# Patient Record
Sex: Male | Born: 1955 | Race: White | Hispanic: No | Marital: Married | Smoking: Never smoker
Health system: Southern US, Community
[De-identification: ages and names within clinical notes are randomized; demographics above are authoritative.]

## PROBLEM LIST (undated history)

## (undated) DIAGNOSIS — I10 Essential (primary) hypertension: Secondary | ICD-10-CM

## (undated) DIAGNOSIS — E119 Type 2 diabetes mellitus without complications: Secondary | ICD-10-CM

## (undated) DIAGNOSIS — E785 Hyperlipidemia, unspecified: Secondary | ICD-10-CM

## (undated) DIAGNOSIS — C61 Malignant neoplasm of prostate: Secondary | ICD-10-CM

## (undated) HISTORY — DX: Essential (primary) hypertension: I10

## (undated) HISTORY — DX: Hyperlipidemia, unspecified: E78.5

## (undated) HISTORY — PX: HERNIA REPAIR: SHX51

## (undated) HISTORY — PX: PROSTATECTOMY: SHX69

## (undated) HISTORY — PX: BACK SURGERY: SHX140

## (undated) HISTORY — DX: Malignant neoplasm of prostate: C61

---

## 2001-10-16 ENCOUNTER — Emergency Department (HOSPITAL_COMMUNITY): Admission: EM | Admit: 2001-10-16 | Discharge: 2001-10-16 | Payer: Self-pay | Admitting: *Deleted

## 2001-10-22 ENCOUNTER — Emergency Department (HOSPITAL_COMMUNITY): Admission: EM | Admit: 2001-10-22 | Discharge: 2001-10-22 | Payer: Self-pay | Admitting: Emergency Medicine

## 2008-04-14 ENCOUNTER — Encounter (INDEPENDENT_AMBULATORY_CARE_PROVIDER_SITE_OTHER): Payer: Self-pay | Admitting: Urology

## 2008-04-14 ENCOUNTER — Inpatient Hospital Stay (HOSPITAL_COMMUNITY): Admission: RE | Admit: 2008-04-14 | Discharge: 2008-04-16 | Payer: Self-pay | Admitting: Urology

## 2008-10-03 ENCOUNTER — Encounter: Admission: RE | Admit: 2008-10-03 | Discharge: 2008-10-03 | Payer: Self-pay | Admitting: Orthopedic Surgery

## 2010-12-31 ENCOUNTER — Encounter: Payer: Self-pay | Admitting: Orthopedic Surgery

## 2011-04-24 NOTE — Op Note (Signed)
Mark Velez, RODRIGUE NO.:  [id number]   MEDICAL RECORD NO.:  [id number]          PATIENT TYPE:  INP   LOCATION:  1432                         FACILITY:  [facility]   PHYSICIAN:  Valetta Fuller, M.D.  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   PROCEDURE PERFORMED:  Robotic assisted laparoscopic radical retropubic  prostatectomy.   SURGEON:  Valetta Fuller, M.D.   ASSISTANTEarlene Plater.   ANESTHESIA:  General endotracheal.   INDICATIONS:  Mark Velez is a 55 year old male.  He was initially  seen and then diagnosed with favorable clinical stage T1C adenocarcinoma  of the prostate by Dr. Retta Diones.  The patient ended up with a Gleason 3  plus 3 equal 6 cancer.  The biopsies were limited to the right apex of  the prostate.  The patient's PSA was minimally elevated at 4.2.  The  patient had moderate prostatic enlargement with a prostate of  approximately 65 grams.  He had normal sexual functioning with an IPSS  score of 6.  The patient underwent extensive consultation with Dr.  Retta Diones as well as myself about his treatment options.  We felt that  he did have relatively low risk prostate cancer, but also was a very  young.  Preoperative cardiac clearance was also obtained.  After  discussing all the options with him, he elected to proceed with radical  retropubic prostatectomy by a robotic laparoscopic approach.  He  appeared to understand the potential complications and issues with  regard to long-term incontinence, sexual dysfunction as well as issues  related to surgery of this magnitude.  Full informed consent was  obtained.   TECHNIQUE AND FINDINGS:  The patient was brought to the  operating room.  He had placement of compression boots to the knee.  He received  perioperative Unasyn.  He had successful induction of general  endotracheal anesthesia and then was placed in the mid lithotomy  position.  He was carefully secured to the operating table.  All  extremities were carefully padded.  He was placed in a steep  Trendelenburg position and prepped and draped in the usual manner.  Foley catheter was inserted sterilely on the field.   Initial incision site was chosen just to the left of the umbilicus  approximately 18 cm above the pubic symphysis.  A standard open Hasson  technique was utilized and a 12 mm cannula was placed.  The abdomen was  insufflated without incident.  Careful inspection of the pelvis revealed  no obvious pathology.  All other trocars were placed with direct visual  guidance.  This included 12 and 5 mm assist ports and three 8 mm robotic  trocars.  Once all trocars were in position, the surgical cart was  docked.  The bladder was filled.  The space of Retzius was entered  utilizing hot electrocautery  scissors.  There was considerable fat  overlying the prostatic endopelvic fascia and sidewall which was  carefully taken off to help identify the prostate and bladder neck  region.  Endopelvic fascia was then incised from apex to base.  Levator  musculature was swept off the apex of the prostate and the dorsal venous  complex was isolated and then stapled.  There was moderate oozing from  the dorsal vein complex and more blood loss than typical was  encountered.  Suture ligation of the dorsal vein complex was required to  control that which was then established.  Attention was then turned  towards the anterior bladder neck.  This was incised after  identification with the aid of the Foley balloon.  Once the anterior  bladder neck was incised, the Foley catheter was identified in the  midline and brought up anteriorly to provide traction.  It was  obvious  that the patient did have a component of a middle lobe.  Ureteral  orifices were identified with the aid of indigo carmine.  The posterior  bladder neck was then transected along with the middle lobe of the  prostate.  Posteriorly vas deferens and seminal vesicles were each  individually isolated and dissected free.  Clips were used on the tips  of the seminal vesicles to reduce cautery usage.  Once this was  completed, the posterior plane between the prostate and rectum was  established.   Attention was then turned towards the pedicles of the prostate.  First  however, we incised the lateral superficial fascia off the prostate  bilaterally.  This was then used to establish the nerve-sparing plain  from apex to base and the neurovascular bundles were swept off the  lateral posterior aspect of the prostate bilaterally with what appeared  to be excellent preservation of a large amount of neurovascular tissue  along with intact superficial fascia right on the side of the prostate.  The pedicles were then clipped with hematic clips.  Neurovascular bundle  was then slipped off the apex of the prostate isolating the urethra.  This was then transected anteriorly, the catheter was removed and the  posterior transection occurred.  The prostatic specimen was then removed  from the pelvis and the pelvis was copiously irrigated.  There was  additional oozing from the left vascular pedicle which required some  suture ligation.  The bladder neck was carefully inspected and did not  feel it required formal closure.   Reconstruction then ensued.  We did not feel that the patient require  pelvic lymph node dissection because he was at such low risk for  positive nodes.  The posterior bladder neck and posterior urethra were  reapproximated utilizing a 2-0 Vicryl suture to reapproximate the  structures and also to reduce attention.  Once that was accomplished,  the rest of the anastomosis was  performed with a double arm 3-0 Monocryl  suture in a running 360 degree manner.  A new coude catheter was placed  without difficulty and the bladder anastomosis appeared watertight with  no obvious extravasation of irrigation fluid and clear blue dye coming  from the bladder.  A pelvic drain was placed and then secured to the  skin.  All trocar sites were carefully inspected upon removal.  The  prostatic specimen was placed in the Endopouch bag.  This was then  removed after extension of his camera port incision.  The camera port  incision was then closed with a running #1 Vicryl  suture.  All wounds  were infiltrated with lidocaine/Marcaine.  Skin was then closed with  clips.  The patient appeared to tolerate the procedure well.  Estimated  blood loss was approximately 1300 mL, but he remained hemodynamically  stable throughout the procedure and there appeared to be good urinary  output.  He was brought to the recovery room in stable condition.           ______________________________  Valetta Fuller, M.D.  Electronically Signed     DSG/MEDQ  D:  04/19/2008  T:  04/19/2008  Job:  [id number]

## 2011-09-04 LAB — BASIC METABOLIC PANEL
BUN: 12
CO2: 30
Calcium: 9.7
Chloride: 105
Creatinine, Ser: 0.85
GFR calc Af Amer: >60
GFR calc non Af Amer: >60
Glucose, Bld: 118 — ABNORMAL HIGH
Potassium: 4.2
Sodium: 141

## 2011-09-04 LAB — HEMOGLOBIN AND HEMATOCRIT, BLOOD: Hemoglobin: 14.3

## 2018-11-23 ENCOUNTER — Encounter: Payer: Self-pay | Admitting: Emergency Medicine

## 2018-11-23 ENCOUNTER — Emergency Department
Admission: EM | Admit: 2018-11-23 | Discharge: 2018-11-23 | Disposition: A | Discharge status: Home / Self Care | Payer: 59 | Source: Home / Self Care

## 2018-11-23 DIAGNOSIS — J0120 Acute ethmoidal sinusitis, unspecified: Secondary | ICD-10-CM | POA: Diagnosis not present

## 2018-11-23 HISTORY — DX: Type 2 diabetes mellitus without complications: E11.9

## 2018-11-23 LAB — POCT INFLUENZA A/B
Influenza A, POC: NEGATIVE
Influenza B, POC: NEGATIVE

## 2018-11-23 MED ORDER — AMOXICILLIN-POT CLAVULANATE 875-125 MG PO TABS: 1.0000 | ORAL_TABLET | Freq: Two times a day (BID) | ORAL | 0 refills | Status: DC

## 2018-11-23 NOTE — ED Triage Notes (Signed)
Patient c/o head congestion x 4 days, non-productive cough, facial pressure, runny nose, bilateral ear pain.

## 2018-11-23 NOTE — ED Provider Notes (Signed)
[facility]    CSN: [id number] Arrival date & time: 11/23/18  1112     History   Chief Complaint Chief Complaint  Patient presents with  . URI    HPI Mark Velez is a 62 y.o. male.   The history is provided by the patient. No language interpreter was used.  URI  Presenting symptoms: congestion and cough   Severity:  Moderate Onset quality:  Gradual Duration:  4 days Timing:  Constant Progression:  Worsening Chronicity:  New Relieved by:  Nothing Worsened by:  Nothing Ineffective treatments:  None tried Associated symptoms: sinus pain   Risk factors: diabetes mellitus   Pt complains of sinus drainage and congetsion   Past Medical History:  Diagnosis Date  . Diabetes mellitus without complication (HCC)     There are no active problems to display for this patient.   History reviewed. No pertinent surgical history.     Home Medications    Prior to Admission medications   Medication Sig Start Date End Date Taking? Authorizing Provider  glimepiride (AMARYL) 4 MG tablet Take by mouth. 06/09/18 06/09/19 Yes [provider]  hydrochlorothiazide (HYDRODIURIL) 25 MG tablet TAKE 1 TABLET BY MOUTH EVERY DAY FOR 90 DAYS 11/25/16  Yes [provider]  Insulin Glargine (BASAGLAR KWIKPEN) 100 UNIT/ML SOPN Inject into the skin. 10/23/18  Yes [provider]  lisinopril (PRINIVIL,ZESTRIL) 40 MG tablet Take by mouth. 12/28/15  Yes [provider]  metFORMIN (GLUCOPHAGE-XR) 500 MG 24 hr tablet TAKE 2 TABLETS BY MOUTH TWICE A DAY 08/04/18  Yes [provider]  pravastatin (PRAVACHOL) 80 MG tablet Take by mouth. 05/15/16  Yes [provider]  amoxicillin-clavulanate (AUGMENTIN) 875-125 MG tablet Take 1 tablet by mouth every 12 (twelve) hours. 11/23/18   Elson Areas, PA-C    Family History No family history on file.  Social History Social History   Tobacco Use  . Smoking status: Never Smoker  .  Smokeless tobacco: Never Used  Substance Use Topics  . Alcohol use: Not on file  . Drug use: Not on file     Allergies   Sulfa antibiotics   Review of Systems Review of Systems  HENT: Positive for congestion and sinus pain.   Respiratory: Positive for cough.   All other systems reviewed and are negative.    Physical Exam Triage Vital Signs ED Triage Vitals  Enc Vitals Group     BP 11/23/18 1134 (!) 142/81     Pulse Rate 11/23/18 1134 86     Resp --      Temp 11/23/18 1134 98 F (36.7 C)     Temp Source 11/23/18 1134 Oral     SpO2 11/23/18 1134 95 %     Weight 11/23/18 1135 233 lb (105.7 kg)     Height 11/23/18 1135 5\' 6"  (1.676 m)     Head Circumference --      Peak Flow --      Pain Score 11/23/18 1135 4     Pain Loc --      Pain Edu? --      Excl. in GC? --    No data found.  Updated Vital Signs BP (!) 142/81 (BP Location: Right Arm)   Pulse 86   Temp 98 F (36.7 C) (Oral)   Ht 5\' 6"  (1.676 m)   Wt 233 lb (105.7 kg)   SpO2 95%   BMI 37.61 kg/m   Visual Acuity Right Eye Distance:  Left Eye Distance:   Bilateral Distance:    Right Eye Near:   Left Eye Near:    Bilateral Near:     Physical Exam Vitals signs and nursing note reviewed.  Constitutional:      Appearance: He is well-developed.  HENT:     Head: Normocephalic and atraumatic.     Right Ear: Tympanic membrane normal.     Left Ear: Tympanic membrane normal.     Nose: Nose normal.     Mouth/Throat:     Mouth: Mucous membranes are moist.  Eyes:     Conjunctiva/sclera: Conjunctivae normal.  Neck:     Musculoskeletal: Neck supple.  Cardiovascular:     Rate and Rhythm: Normal rate and regular rhythm.     Heart sounds: No murmur.  Pulmonary:     Effort: Pulmonary effort is normal. No respiratory distress.     Breath sounds: Normal breath sounds.  Abdominal:     Palpations: Abdomen is soft.     Tenderness: There is no abdominal tenderness.  Skin:    General: Skin is warm and dry.    Neurological:     Mental Status: He is alert.      UC Treatments / Results  Labs (all labs ordered are listed, but only abnormal results are displayed) Labs Reviewed  POCT INFLUENZA A/B    EKG None  Radiology No results found.  Procedures Procedures (including critical care time)  Medications Ordered in UC Medications - No data to display  Initial Impression / Assessment and Plan / UC Course  I have reviewed the triage vital signs and the nursing notes.  Pertinent labs & imaging results that were available during my care of the patient were reviewed by me and considered in my medical decision making (see chart for details).     MDM  Influenza negative.  Pt started on augmentin.  Pt advised to see his MD for recheck in 3-4 days.  Final Clinical Impressions(s) / UC Diagnoses   Final diagnoses:  Acute ethmoidal sinusitis, recurrence not specified     Discharge Instructions     Return if any problems.    ED Prescriptions    Medication Sig Dispense Auth. Provider   amoxicillin-clavulanate (AUGMENTIN) 875-125 MG tablet Take 1 tablet by mouth every 12 (twelve) hours. 20 tablet Elson AreasSofia, Wen Merced K, [place]PA-C     Controlled Substance Prescriptions [place] Controlled Substance Registry consulted? Not Applicable  An After Visit Summary was printed and given to the patient.    Elson AreasSofia, Kimmberly Wisser K, [place]-C 11/23/18 1242

## 2018-11-23 NOTE — Discharge Instructions (Signed)
Return if any problems.

## 2021-06-26 ENCOUNTER — Other Ambulatory Visit: Payer: Self-pay

## 2021-06-26 DIAGNOSIS — M25552 Pain in left hip: Secondary | ICD-10-CM

## 2021-07-08 ENCOUNTER — Other Ambulatory Visit: Payer: 59

## 2021-07-17 ENCOUNTER — Ambulatory Visit
Admission: RE | Admit: 2021-07-17 | Discharge: 2021-07-17 | Disposition: A | Discharge status: Home / Self Care | Payer: 59 | Source: Ambulatory Visit | Attending: Orthopedic Surgery | Admitting: Orthopedic Surgery

## 2021-07-17 ENCOUNTER — Other Ambulatory Visit: Payer: Self-pay

## 2021-07-17 DIAGNOSIS — M25552 Pain in left hip: Secondary | ICD-10-CM

## 2022-01-07 IMAGING — MR MR HIP*L* W/O CM
5 series · 36 of 40 positions shown · non-contrast
Comparison: None.

CLINICAL DATA: Low back, left hip, and leg pain for the past 2
months. No prior surgery.

EXAM:
MR OF THE LEFT HIP WITHOUT CONTRAST
TECHNIQUE: Multiplanar, multisequence MR imaging was performed. No intravenous
contrast was administered.

[Series 8: T2 fat-sat · coronal · left · 3.0mm · 0.89mm/px · 9 of 30 slices shown (1 of 2)]
[im 1/30]
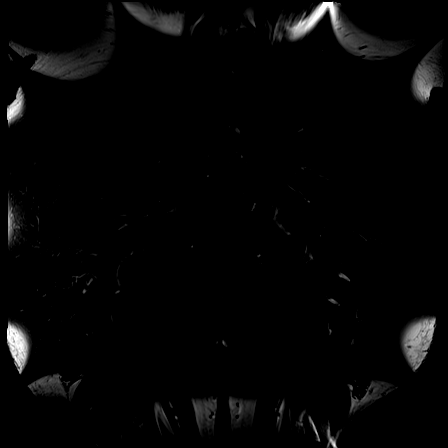
[im 4/30]
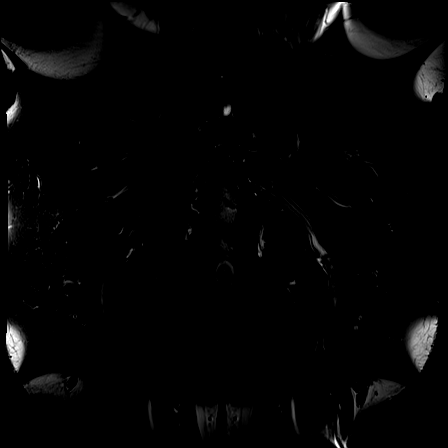
[im 8/30]
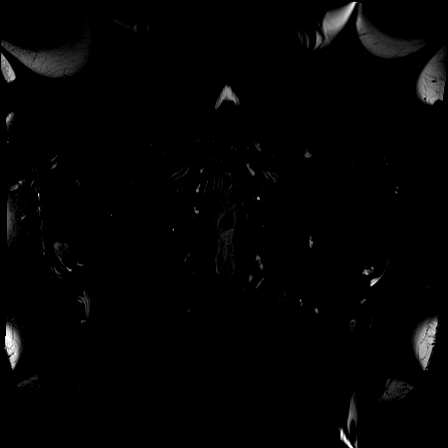
[im 11/30]
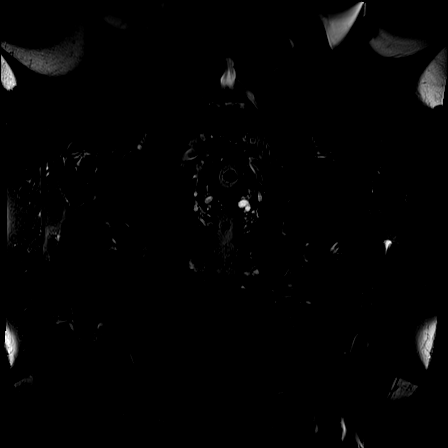
[im 15/30]
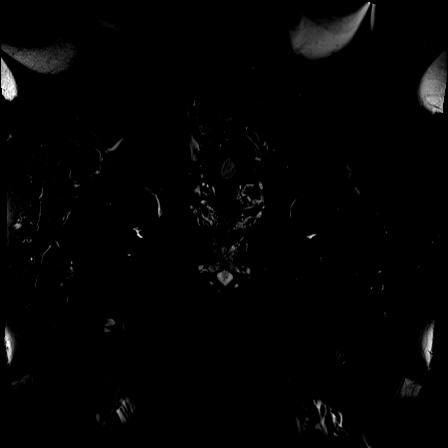
[im 19/30]
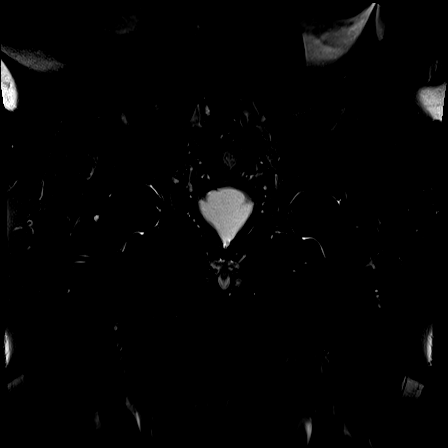
[im 22/30]
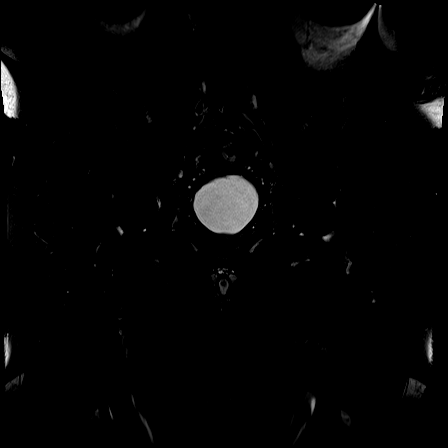
[im 26/30]
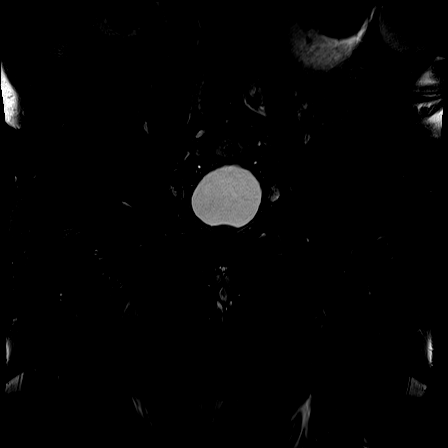
[im 30/30]
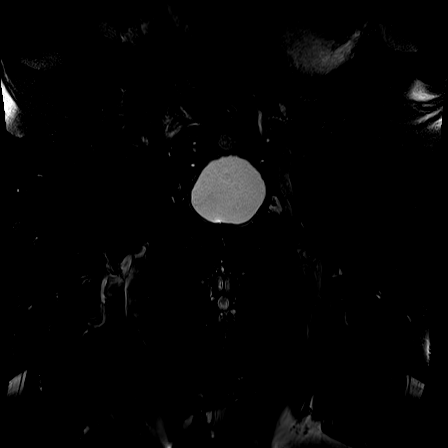

[Series 9: T1 · coronal · left · 3.0mm · 0.89mm/px · 4 of 30 slices shown]
[im 1/30]
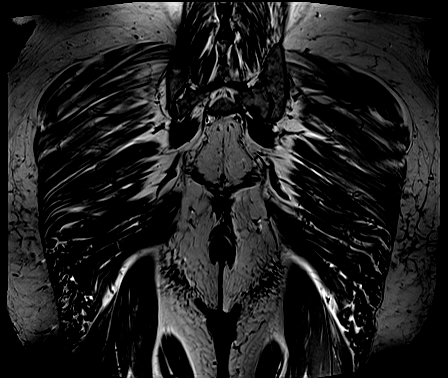
[im 5/30]
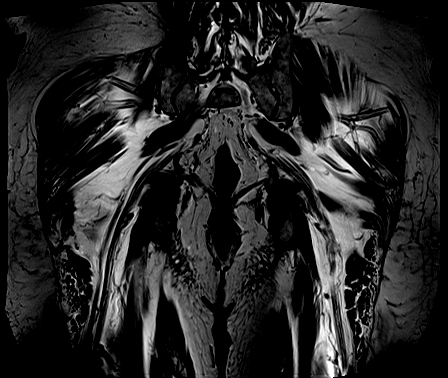
[im 9/30]
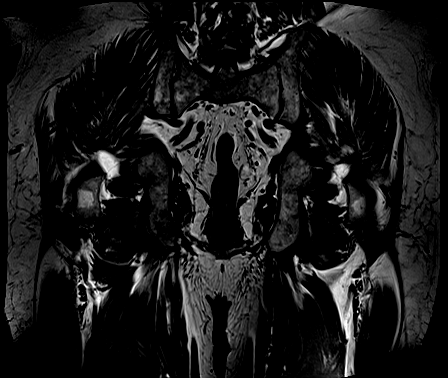
[im 13/30]
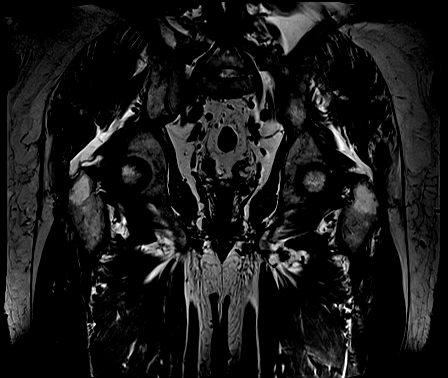

[Series 10: T2 fat-sat · axial · left · 3.0mm · 1.19mm/px · z∈[+81,+174]mm · 7 of 27 slices shown (2 of 2)]
[im 1/27]
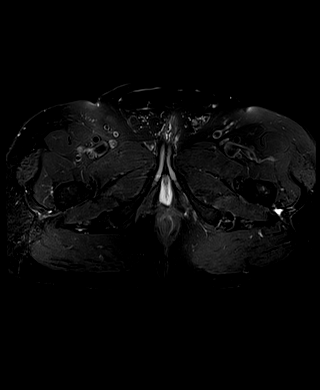
[im 5/27]
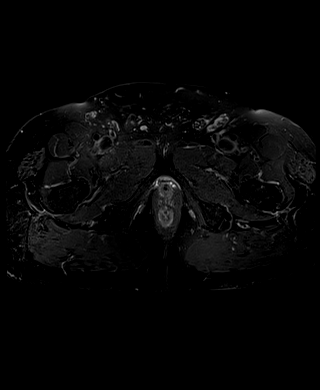
[im 9/27]
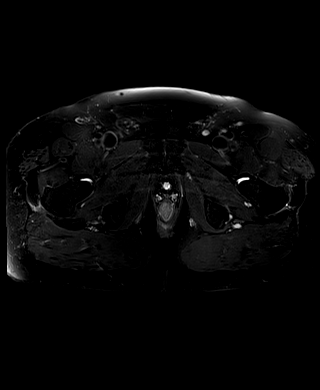
[im 14/27]
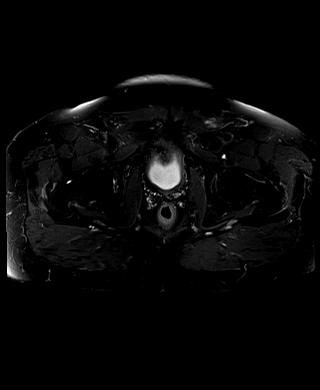
[im 18/27]
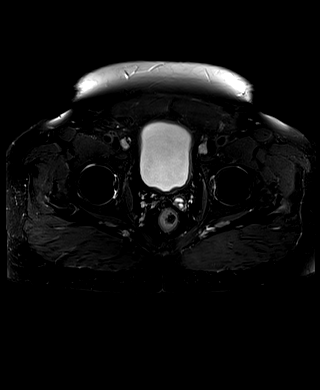
[im 22/27]
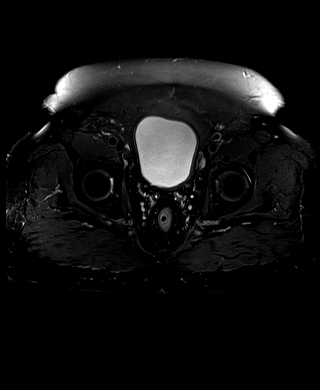
[im 27/27]
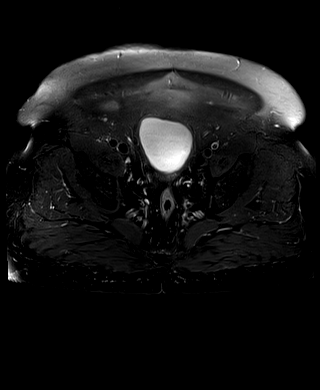

[Series 11: PD fat-sat · coronal · left · 3.0mm · 0.56mm/px · 7 of 28 slices shown (1 of 2)]
[im 1/28]
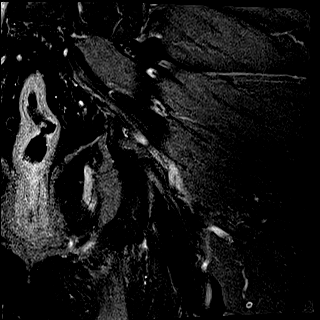
[im 5/28]
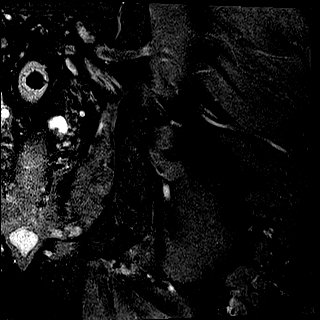
[im 10/28]
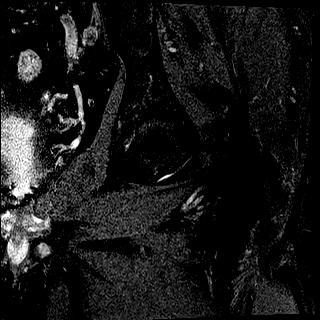
[im 14/28]
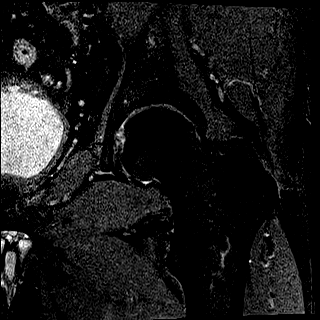
[im 19/28]
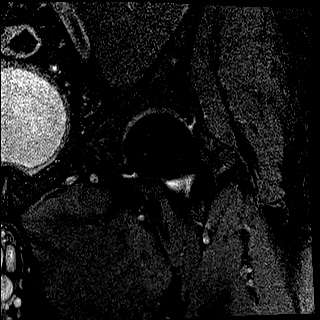
[im 23/28]
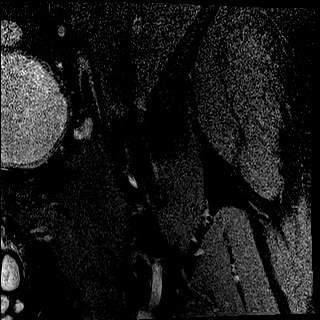
[im 28/28]
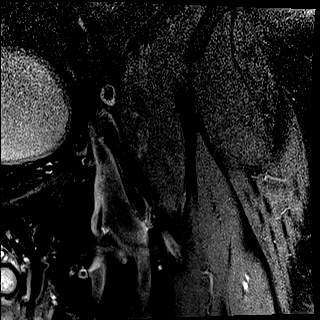

[Series 12: PD fat-sat · sagittal · left · 3.0mm · 0.56mm/px · 9 of 35 slices shown (2 of 2)]
[im 1/35]
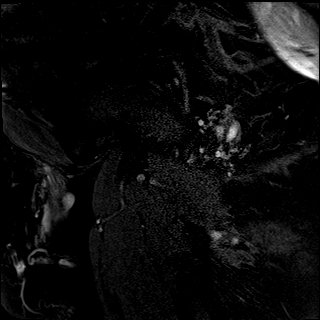
[im 5/35]
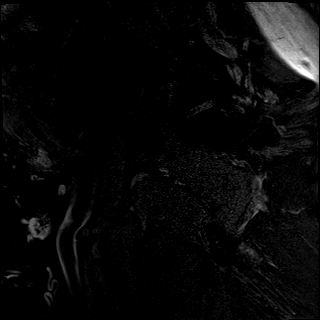
[im 9/35]
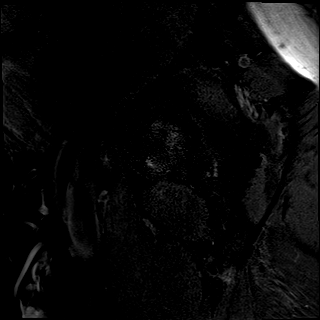
[im 13/35]
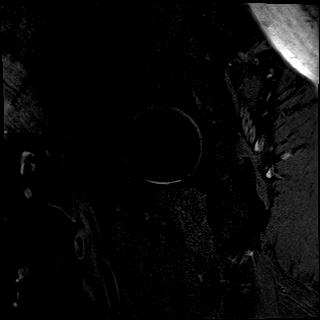
[im 18/35]
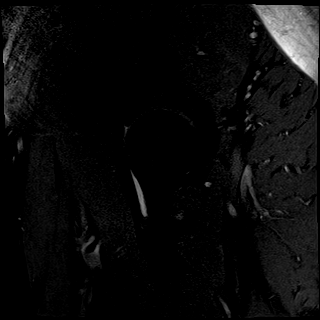
[im 22/35]
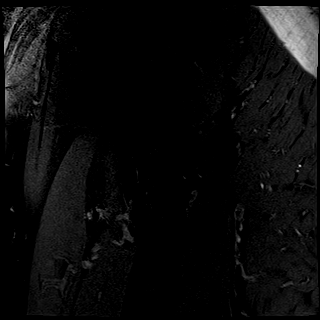
[im 26/35]
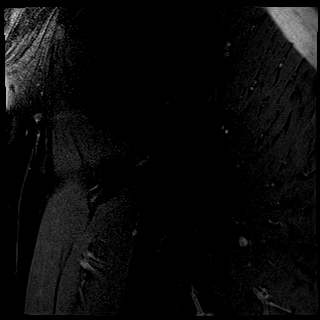
[im 30/35]
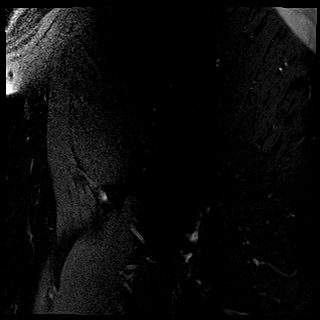
[im 35/35]
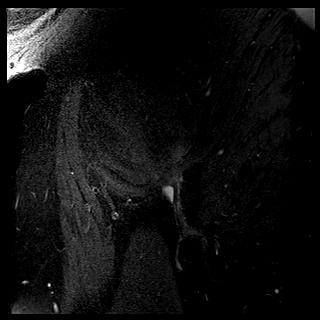

[36 of 40 positions shown; findings below may reference images not displayed]

FINDINGS: Bones: There is no evidence of acute fracture, dislocation or
avascular necrosis. No focal bone lesion. The visualized sacroiliac
joints appear normal. Mild degenerative changes of the pubic
symphysis.

Articular cartilage and labrum

Articular cartilage: No focal chondral defect or subchondral signal
abnormality identified.

Labrum: Left anterior superior labral tear (series 10, images 8-9;
series 12, images 11-12). No paralabral abnormality. The right
labrum is grossly intact.

Joint or bursal effusion

Joint effusion: No significant hip joint effusion.

Bursae: Small amount of fluid in the left greater trochanteric
bursa.

Muscles and tendons

Muscles and tendons: Mild right gluteus minimus tendinosis. No
muscle edema or atrophy.

Other findings

Miscellaneous: Prior prostatectomy. The visualized internal pelvic
contents appear unremarkable.
IMPRESSION: 1. Left anterior superior labral tear.
2. Mild left greater trochanteric bursitis.
3. Mild right gluteus minimus tendinosis.

## 2022-03-29 ENCOUNTER — Ambulatory Visit: Payer: 59 | Admitting: Cardiovascular Disease

## 2022-04-02 ENCOUNTER — Ambulatory Visit: Payer: 59 | Admitting: Cardiology

## 2024-10-09 NOTE — Progress Notes (Signed)
 Referring-Courtney Katina PA-C Reason for referral-Murmur  HPI: 68 yo male for evaluation of murmur at request of Charmaine Katina PA-C. Previously seen 2021 at [facility] for CP; declined cath as father died following cath in the past. Coronary CTA and echo ordered but not performed.  Patient denies dyspnea on exertion, orthopnea or PND.  Occasional mild pedal edema.  He has occasional chest pain in the left breast area described as a dull sensation without radiation or associated symptoms.  Not clearly exertional.  Lasts 10 to 15 minutes and resolves.  Cardiology now asked to evaluate.  Current Outpatient Medications  Medication Sig Dispense Refill   empagliflozin (JARDIANCE) 25 MG TABS tablet Take 25 mg by mouth daily.     glimepiride (AMARYL) 4 MG tablet Take by mouth.     Insulin Glargine (BASAGLAR KWIKPEN) 100 UNIT/ML SOPN Inject into the skin.     metFORMIN (GLUCOPHAGE-XR) 500 MG 24 hr tablet TAKE 2 TABLETS BY MOUTH TWICE A DAY     Olmesartan-amLODIPine-HCTZ 40-5-25 MG TABS Take 1 tablet by mouth daily.     pravastatin (PRAVACHOL) 80 MG tablet Take by mouth.     No current facility-administered medications for this visit.    Allergies  Allergen Reactions   Sulfa Antibiotics Rash     Past Medical History:  Diagnosis Date   Diabetes mellitus without complication (HCC)    Hyperlipidemia    Hypertension    Prostate cancer (HCC)     Past Surgical History:  Procedure Laterality Date   BACK SURGERY     HERNIA REPAIR     PROSTATECTOMY      Social History   Socioeconomic History   Marital status: Married    Spouse name: Not on file   Number of children: 4   Years of education: Not on file   Highest education level: Not on file  Occupational History   Not on file  Tobacco Use   Smoking status: Former    Types: Cigarettes   Smokeless tobacco: Never  Substance and Sexual Activity   Alcohol use: Yes    Comment: Rare   Drug use: Not on file   Sexual activity: Not  on file  Other Topics Concern   Not on file  Social History Narrative   Not on file   Social Drivers of Health   Financial Resource Strain: Low Risk  (05/29/2024)   Received [facility]   Overall Financial Resource Strain (CARDIA)    Difficulty of Paying Living Expenses: Not hard at all  Food Insecurity: No Food Insecurity (05/29/2024)   Received from [facility]   Hunger Vital Sign    Within the past 12 months, you worried that your food would run out before you got the money to buy more.: Never true    Within the past 12 months, the food you bought just didn't last and you didn't have money to get more.: Never true  Transportation Needs: No Transportation Needs (05/29/2024)   Received from [facility] - Transportation    Lack of Transportation (Medical): No    Lack of Transportation (Non-Medical): No  Physical Activity: Sufficiently Active (05/29/2024)   Received from [facility]   Exercise Vital Sign    On average, how many days per week do you engage in moderate to strenuous exercise (like a brisk walk)?: 4 days    On average, how many minutes do you engage in exercise at this level?: 60 min  Stress: No Stress Concern Present (01/16/2023)   Received from [facility] - Occupational Stress Questionnaire    Feeling of Stress : Only a little  Social Connections: Socially Integrated (05/29/2024)   Received from [facility]   Social Network    How would you rate your social network (family, work, friends)?: Good participation with social networks  Intimate Partner Violence: Not At Risk (05/29/2024)   Received from [facility]   HITS    Over the last 12 months how often did your partner physically hurt you?: Never    Over the last 12 months how often did your partner insult you or talk down to you?: Never    Over the last 12 months how often did your partner threaten you with physical harm?: Never    Over the last  12 months how often did your partner scream or curse at you?: Never    Family History  Problem Relation Age of Onset   AAA (abdominal aortic aneurysm) Mother    Heart attack Father     ROS: no fevers or chills, productive cough, hemoptysis, dysphasia, odynophagia, melena, hematochezia, dysuria, hematuria, rash, seizure activity, orthopnea, PND, pedal edema, claudication. Remaining systems are negative.  Physical Exam:   Blood pressure 130/72, pulse 79, height 5' 6 (1.676 m), weight 225 lb (102.1 kg), SpO2 94%.  General:  Well developed/well nourished in NAD Skin warm/dry Patient not depressed No peripheral clubbing Back-normal HEENT-normal/normal eyelids Neck supple/normal carotid upstroke bilaterally; no bruits; no JVD; no thyromegaly chest - CTA/ normal expansion CV - RRR/normal S1 and S2; 2/6 systolic murmur left sternal border, rubs or gallops;  PMI nondisplaced Abdomen -NT/ND, no HSM, no mass, + bowel sounds, positive bruit 2+ femoral pulses, no bruits Ext-trace edema, no chords, 2+ DP Neuro-grossly nonfocal  EKG Interpretation Date/Time:  Monday October 19 2024 15:41:53 EST Ventricular Rate:  79 PR Interval:  260 QRS Duration:  92 QT Interval:  364 QTC Calculation: 417 R Axis:   144  Text Interpretation: Sinus rhythm with 1st degree A-V block Right axis deviation Right ventricular hypertrophy Confirmed by Pietro Rogue ([id number]) on 10/19/2024 3:44:11 PM    A/P  1 Murmur-probable aortic sclerosis versus mild aortic stenosis.  Will arrange echocardiogram to further assess.  2 chest pain-symptoms somewhat atypical but multiple risk factors including diabetes, hypertension, hyperlipidemia and family history.  Will arrange coronary CTA to rule out obstructive coronary disease.  3 hypertension-blood pressure controlled.  Continue present medical regimen.  4 hyperlipidemia-most recent LDL 90 and not at goal.  He did not tolerate Crestor/Lipitor previously secondary  to myalgias.  Continue pravastatin and add Zetia 10 mg daily.  Check lipids and liver in 8 weeks.  5 bruit-schedule abdominal ultrasound to rule out aneurysm.  Rogue Pietro, MD

## 2024-10-19 ENCOUNTER — Encounter: Payer: Self-pay | Admitting: Cardiology

## 2024-10-19 ENCOUNTER — Ambulatory Visit: Admitting: Cardiology

## 2024-10-19 VITALS — BP 130/72 | HR 79 | Ht 66.0 in | Wt 225.0 lb

## 2024-10-19 DIAGNOSIS — R0989 Other specified symptoms and signs involving the circulatory and respiratory systems: Secondary | ICD-10-CM | POA: Diagnosis not present

## 2024-10-19 DIAGNOSIS — R011 Cardiac murmur, unspecified: Secondary | ICD-10-CM | POA: Diagnosis not present

## 2024-10-19 DIAGNOSIS — R072 Precordial pain: Secondary | ICD-10-CM | POA: Diagnosis not present

## 2024-10-19 DIAGNOSIS — Z136 Encounter for screening for cardiovascular disorders: Secondary | ICD-10-CM

## 2024-10-19 DIAGNOSIS — E785 Hyperlipidemia, unspecified: Secondary | ICD-10-CM

## 2024-10-19 MED ORDER — EZETIMIBE 10 MG PO TABS: 10.0000 mg | ORAL_TABLET | Freq: Every day | ORAL | 3 refills | Status: AC

## 2024-10-19 MED ORDER — METOPROLOL TARTRATE 100 MG PO TABS: ORAL_TABLET | ORAL | 0 refills | Status: AC

## 2024-10-19 NOTE — Patient Instructions (Addendum)
 Medication Instructions:   START EZETIMIBE 10 MG ONCE DAILY  *If you need a refill on your cardiac medications before your next appointment, please call your pharmacy*  Lab Work:  Your physician recommends that you return for lab work in: [facility]  If you have labs (blood work) drawn today and your tests are completely normal, you will receive your results only by: MyChart Message (if you have MyChart) OR A paper copy in the mail If you have any lab test that is abnormal or we need to change your treatment, we will call you to review the results.  Testing/Procedures:  Your physician has requested that you have an echocardiogram. Echocardiography is a painless test that uses sound waves to create images of your heart. It provides your doctor with information about the size and shape of your heart and how well your heart's chambers and valves are working. This procedure takes approximately one hour. There are no restrictions for this procedure. Please do NOT wear cologne, perfume, aftershave, or lotions (deodorant is allowed). Please arrive 15 minutes prior to your appointment time.  Please note: We ask at that you not bring children with you during ultrasound (echo/ vascular) testing. Due to room size and safety concerns, children are not allowed in the ultrasound rooms during exams. Our front office staff cannot provide observation of children in our lobby area while testing is being conducted. An adult accompanying a patient to their appointment will only be allowed in the ultrasound room at the discretion of the ultrasound technician under special circumstances. We apologize for any inconvenience. [facility] 1 ST FLOOR IMAGING DEPARTMENT   Your physician has requested that you have cardiac CT. Cardiac computed tomography (CT) is a painless test that uses an x-ray machine to take clear, detailed pictures of your heart. For further information please visit  [email or web address] Please follow instruction sheet as given.   Your physician has requested that you have an abdominal aorta duplex. During this test, an ultrasound is used to evaluate the aorta. Allow 30 minutes for this exam. Do not eat after midnight the day before and avoid carbonated beverages.  Please note: We ask at that you not bring children with you during ultrasound (echo/ vascular) testing. Due to room size and safety concerns, children are not allowed in the ultrasound rooms during exams. Our front office staff cannot provide observation of children in our lobby area while testing is being conducted. An adult accompanying a patient to their appointment will only be allowed in the ultrasound room at the discretion of the ultrasound technician under special circumstances. We apologize for any inconvenience. [facility]  Follow-Up: At [facility], you and your health needs are our priority.  As part of our continuing mission to provide you with exceptional heart care, our providers are all part of one team.  This team includes your primary Cardiologist (physician) and Advanced Practice Providers or APPs (Physician Assistants and Nurse Practitioners) who all work together to provide you with the care you need, when you need it.  Your next appointment:   6 month(s)  Provider:   REDELL SHALLOW MD    Other Instructions    Your cardiac CT will be scheduled at    [facility] [address] [place], [place] [address] [phone]    If scheduled at [facility], please arrive 30 minutes early for check-in and test prep.  Please follow these instructions carefully (unless otherwise directed):  An IV will be required for this test and Nitroglycerin will be given.  Hold all erectile dysfunction medications at least 3 days (72 hrs) prior to test. (Ie viagra, cialis, sildenafil, tadalafil, etc)   On the Night Before the Test: Be sure to  Drink plenty of water. Do not consume any caffeinated/decaffeinated beverages or chocolate 12 hours prior to your test. Do not take any antihistamines 12 hours prior to your test.  On the Day of the Test: Drink plenty of water until 1 hour prior to the test. Do not eat any food 1 hour prior to test. You may take your regular medications prior to the test.  Take metoprolol (Lopressor) 100 MG two hours prior to test. Patients who wear a continuous glucose monitor MUST remove the device prior to scanning.       After the Test: Drink plenty of water. After receiving IV contrast, you may experience a mild flushed feeling. This is normal. On occasion, you may experience a mild rash up to 24 hours after the test. This is not dangerous. If this occurs, you can take Benadryl 25 mg, Zyrtec, Claritin, or Allegra and increase your fluid intake. (Patients taking Tikosyn should avoid Benadryl, and may take Zyrtec, Claritin, or Allegra) If you experience trouble breathing, this can be serious. If it is severe call 911 IMMEDIATELY. If it is mild, please call our office.  We will call to schedule your test 2-4 weeks out understanding that some insurance companies will need an authorization prior to the service being performed.   For more information and frequently asked questions, please visit our website : [email or web address]  For non-scheduling related questions, please contact the cardiac imaging nurse navigator should you have any questions/concerns: Cardiac Imaging Nurse Navigators Direct Office Dial: [phone]   For scheduling needs, including cancellations and rescheduling, please call Brittany, [phone].

## 2024-11-30 ENCOUNTER — Ambulatory Visit (HOSPITAL_BASED_OUTPATIENT_CLINIC_OR_DEPARTMENT_OTHER)

## 2024-12-01 ENCOUNTER — Ambulatory Visit (HOSPITAL_BASED_OUTPATIENT_CLINIC_OR_DEPARTMENT_OTHER)

## 2024-12-07 ENCOUNTER — Encounter (HOSPITAL_COMMUNITY): Payer: Self-pay

## 2024-12-07 ENCOUNTER — Ambulatory Visit (HOSPITAL_BASED_OUTPATIENT_CLINIC_OR_DEPARTMENT_OTHER)
Admission: RE | Admit: 2024-12-07 | Discharge: 2024-12-07 | Disposition: A | Discharge status: Home / Self Care | Source: Ambulatory Visit | Attending: Cardiology | Admitting: Cardiology

## 2024-12-07 DIAGNOSIS — Z136 Encounter for screening for cardiovascular disorders: Secondary | ICD-10-CM | POA: Diagnosis present

## 2024-12-07 DIAGNOSIS — R0989 Other specified symptoms and signs involving the circulatory and respiratory systems: Secondary | ICD-10-CM | POA: Insufficient documentation

## 2024-12-08 ENCOUNTER — Ambulatory Visit (HOSPITAL_BASED_OUTPATIENT_CLINIC_OR_DEPARTMENT_OTHER)
Admission: RE | Admit: 2024-12-08 | Discharge: 2024-12-08 | Disposition: A | Discharge status: Home / Self Care | Source: Ambulatory Visit | Attending: Cardiology | Admitting: Cardiology

## 2024-12-08 ENCOUNTER — Ambulatory Visit: Payer: Self-pay | Admitting: Cardiology

## 2024-12-08 DIAGNOSIS — R072 Precordial pain: Secondary | ICD-10-CM | POA: Insufficient documentation

## 2024-12-08 DIAGNOSIS — R931 Abnormal findings on diagnostic imaging of heart and coronary circulation: Secondary | ICD-10-CM

## 2024-12-08 DIAGNOSIS — E785 Hyperlipidemia, unspecified: Secondary | ICD-10-CM

## 2024-12-08 LAB — POCT I-STAT CREATININE: Creatinine, Ser: 1.1 mg/dL (ref 0.61–1.24)

## 2024-12-08 MED ORDER — IOHEXOL 350 MG/ML SOLN
100.0000 mL | Freq: Once | INTRAVENOUS | Status: AC | PRN
  Administered 2024-12-08: 95 mL via INTRAVENOUS

## 2024-12-08 MED ORDER — NITROGLYCERIN 0.4 MG SL SUBL
0.8000 mg | SUBLINGUAL_TABLET | Freq: Once | SUBLINGUAL | Status: AC
  Administered 2024-12-08: 0.8 mg via SUBLINGUAL

## 2024-12-28 ENCOUNTER — Ambulatory Visit (HOSPITAL_BASED_OUTPATIENT_CLINIC_OR_DEPARTMENT_OTHER)
Admission: RE | Admit: 2024-12-28 | Discharge: 2024-12-28 | Disposition: A | Discharge status: Home / Self Care | Source: Ambulatory Visit | Attending: Cardiology | Admitting: Cardiology

## 2024-12-28 DIAGNOSIS — R011 Cardiac murmur, unspecified: Secondary | ICD-10-CM | POA: Insufficient documentation

## 2024-12-28 LAB — ECHOCARDIOGRAM COMPLETE
AR max vel: 0.93 cm2
AV Area VTI: 0.95 cm2
AV Area mean vel: 0.89 cm2
AV Mean grad: 12.7 mmHg
AV Peak grad: 24.3 mmHg
Ao pk vel: 2.47 m/s
Area-P 1/2: 5.06 cm2
Calc EF: 65.4 %
MV M vel: 4.04 m/s
MV Peak grad: 65.1 mmHg
S' Lateral: 2.9 cm
Single Plane A2C EF: 61.2 %
Single Plane A4C EF: 67.7 %

## 2025-01-02 LAB — HEPATIC FUNCTION PANEL
ALT: 9 [IU]/L (ref 0–44)
AST: 13 [IU]/L (ref 0–40)
Albumin: 4.6 g/dL (ref 3.9–4.9)
Alkaline Phosphatase: 108 [IU]/L (ref 47–123)
Bilirubin Total: 0.4 mg/dL (ref 0.0–1.2)
Bilirubin, Direct: 0.13 mg/dL (ref 0.00–0.40)
Total Protein: 7.4 g/dL (ref 6.0–8.5)

## 2025-01-02 LAB — LIPID PANEL
Chol/HDL Ratio: 3 ratio (ref 0.0–5.0)
Cholesterol, Total: 138 mg/dL (ref 100–199)
HDL: 46 mg/dL
LDL Chol Calc (NIH): 62 mg/dL (ref 0–99)
Triglycerides: 178 mg/dL — ABNORMAL HIGH (ref 0–149)
VLDL Cholesterol Cal: 30 mg/dL (ref 5–40)

## 2025-01-05 NOTE — Addendum Note (Signed)
 Addended by: Meleena Munroe L on: 01/05/2025 03:47 PM   Modules accepted: Orders

## 2025-01-27 ENCOUNTER — Ambulatory Visit: Admitting: Cardiology

## 2025-02-19 ENCOUNTER — Ambulatory Visit: Admitting: Pharmacist
